# Patient Record
Sex: Female | Born: 1970 | Race: Black or African American | Hispanic: No | Marital: Single
Health system: Southern US, Community
[De-identification: ages and names within clinical notes are randomized; demographics above are authoritative.]

---

## 2018-04-20 ENCOUNTER — Encounter (HOSPITAL_COMMUNITY): Payer: Self-pay | Admitting: Nurse Practitioner

## 2018-04-20 DIAGNOSIS — R42 Dizziness and giddiness: Secondary | ICD-10-CM | POA: Insufficient documentation

## 2018-04-20 NOTE — ED Triage Notes (Signed)
Pt is c/o unrelenting dizziness for last month, reports she was seen at and [facility] on the 9th and was advised to f/u with PCP if symptoms did not resolve.

## 2018-04-21 ENCOUNTER — Emergency Department (HOSPITAL_COMMUNITY)
Admission: EM | Admit: 2018-04-21 | Discharge: 2018-04-21 | Disposition: A | Discharge status: Home / Self Care | Payer: No Typology Code available for payment source | Attending: Emergency Medicine | Admitting: Emergency Medicine

## 2018-04-21 ENCOUNTER — Encounter (INDEPENDENT_AMBULATORY_CARE_PROVIDER_SITE_OTHER): Payer: Self-pay

## 2018-04-21 ENCOUNTER — Emergency Department (HOSPITAL_COMMUNITY): Payer: No Typology Code available for payment source

## 2018-04-21 DIAGNOSIS — R42 Dizziness and giddiness: Secondary | ICD-10-CM

## 2018-04-21 LAB — COMPREHENSIVE METABOLIC PANEL
ALT: 16 U/L (ref 0–44)
ANION GAP: 7 (ref 5–15)
AST: 21 U/L (ref 15–41)
Albumin: 4 g/dL (ref 3.5–5.0)
Alkaline Phosphatase: 45 U/L (ref 38–126)
BUN: 14 mg/dL (ref 6–20)
CO2: 25 mmol/L (ref 22–32)
Calcium: 8.9 mg/dL (ref 8.9–10.3)
Chloride: 105 mmol/L (ref 98–111)
Creatinine, Ser: 0.83 mg/dL (ref 0.44–1.00)
GFR calc Af Amer: >60 mL/min (ref >60)
GFR calc non Af Amer: >60 mL/min (ref >60)
Glucose, Bld: 100 mg/dL — ABNORMAL HIGH (ref 70–99)
POTASSIUM: 3.5 mmol/L (ref 3.5–5.1)
Sodium: 137 mmol/L (ref 135–145)
Total Bilirubin: 0.5 mg/dL (ref 0.3–1.2)
Total Protein: 7.5 g/dL (ref 6.5–8.1)

## 2018-04-21 LAB — CBC WITH DIFFERENTIAL/PLATELET
Abs Immature Granulocytes: 0.02 10*3/uL (ref 0.00–0.07)
Basophils Absolute: 0.1 10*3/uL (ref 0.0–0.1)
Basophils Relative: 1 %
Eosinophils Absolute: 0.3 10*3/uL (ref 0.0–0.5)
Eosinophils Relative: 5 %
HCT: 39.6 % (ref 36.0–46.0)
Hemoglobin: 13.1 g/dL (ref 12.0–15.0)
Immature Granulocytes: 0 %
Lymphocytes Relative: 33 %
Lymphs Abs: 2.5 10*3/uL (ref 0.7–4.0)
MCH: 28.7 pg (ref 26.0–34.0)
MCHC: 33.1 g/dL (ref 30.0–36.0)
MCV: 86.7 fL (ref 80.0–100.0)
Monocytes Absolute: 0.7 10*3/uL (ref 0.1–1.0)
Monocytes Relative: 10 %
NEUTROS ABS: 4 10*3/uL (ref 1.7–7.7)
Neutrophils Relative %: 51 %
PLATELETS: 227 10*3/uL (ref 150–400)
RBC: 4.57 MIL/uL (ref 3.87–5.11)
RDW: 13.6 % (ref 11.5–15.5)
WBC: 7.6 10*3/uL (ref 4.0–10.5)
nRBC: 0 % (ref 0.0–0.2)

## 2018-04-21 LAB — I-STAT BETA HCG BLOOD, ED (MC, WL, AP ONLY): I-stat hCG, quantitative: <5 m[IU]/mL (ref <5)

## 2018-04-21 LAB — MAGNESIUM: Magnesium: 2.1 mg/dL (ref 1.7–2.4)

## 2018-04-21 MED ORDER — GADOBUTROL 1 MMOL/ML IV SOLN
5.0000 mL | Freq: Once | INTRAVENOUS | Status: AC | PRN
  Administered 2018-04-21: 5 mL via INTRAVENOUS

## 2018-04-21 MED ORDER — SODIUM CHLORIDE 0.9 % IV BOLUS
1000.0000 mL | Freq: Once | INTRAVENOUS | Status: AC
  Administered 2018-04-21: 1000 mL via INTRAVENOUS

## 2018-04-21 MED ORDER — MECLIZINE HCL 25 MG PO TABS
25.0000 mg | ORAL_TABLET | Freq: Once | ORAL | Status: AC
  Administered 2018-04-21: 25 mg via ORAL
  Filled 2018-04-21: qty 1

## 2018-04-21 MED ORDER — MECLIZINE HCL 25 MG PO TABS: 25.0000 mg | ORAL_TABLET | Freq: Three times a day (TID) | ORAL | 0 refills | Status: AC | PRN

## 2018-04-21 NOTE — ED Notes (Signed)
Patient transported to MRI 

## 2018-04-21 NOTE — ED Provider Notes (Signed)
Received care from Dr. Criss AlvineGoldston. Briefly, this is a 47yo female who presents with concern for dizziness.  MR pending.  MRI without acute findings. Patient improved after meclizine. Given rx for meclizine. Recommend PCP follow up. Patient discharged in stable condition with understanding of reasons to return.    Alvira MondaySchlossman, Makell Drohan, MD 04/22/18 [id number]

## 2018-04-21 NOTE — ED Provider Notes (Signed)
[facility] Provider Note   CSN: [id number] Arrival date & time: 04/20/18  2215     History   Chief Complaint Chief Complaint  Patient presents with  . Dizziness    HPI Alexis Bryant is a 47 y.o. female.  HPI  47 year old female presents with dizziness.  Has been ongoing for about 1 months.  She went to urgent [place] a [place] after it [place] and was prescribed prednisone and Zyrtec.  She also started off having hives that started on her trunk at the same time as the dizziness.  Urgent care gave her meds and the hives have gone away but the dizziness remains.  It is intermittent but nothing obvious induces it.  It does not feel like she is going to pass out.  It also does not feel like a room spinning sensation.  She describes it as a feeling of her head floating like it is under water.  Also feels like she is ingested some helium.  There is no blurry vision, vomiting, or weakness in her extremities.  There is no headache.  [place] she had significant symptoms that were worse than typical.  At that time she also felt like she was developing some numbness or a muscle cramp in her right leg as well as her left arm did not feel right.  The symptoms have resolved.  The dizziness is not as bad as it was in the car but is still is coming and going while she has been in the waiting room.  History reviewed. No pertinent past medical history.  There are no active problems to display for this patient.   History reviewed. No pertinent surgical history.   OB History   No obstetric history on file.      Home Medications    Prior to Admission medications   Medication Sig Start Date End Date Taking? Authorizing Provider  albuterol (PROVENTIL HFA;VENTOLIN HFA) 108 (90 Base) MCG/ACT inhaler Inhale 2 puffs into the lungs every 6 (six) hours as needed for wheezing or shortness of  breath.  08/13/17  Yes [provider]  cetirizine (ZYRTEC) 10 MG tablet Take 10 mg by mouth daily.   Yes [provider]  Methylphenidate HCl ER 54 MG TB24 Take 54 mg by mouth daily after breakfast. 11/21/17  Yes [provider]    Family History No family history on file.  Social History Social History   Tobacco Use  . Smoking status: Not on file  Substance Use Topics  . Alcohol use: Not on file  . Drug use: Not on file     Allergies   Patient has no known allergies.   Review of Systems Review of Systems  Constitutional: Negative for fever.  Eyes: Negative for visual disturbance.  Gastrointestinal: Negative for vomiting.  Musculoskeletal: Negative for neck pain.  Neurological: Positive for dizziness and numbness. Negative for weakness.  All other systems reviewed and are negative.    Physical Exam Updated Vital Signs BP 117/69   Pulse 65   Temp 98.8 F (37.1 C) (Oral)   Resp 18   SpO2 99%   Physical Exam Vitals signs and nursing note reviewed.  Constitutional:      General: She is not in acute distress.    Appearance: She is well-developed. She is not ill-appearing or diaphoretic.  HENT:     Head: Normocephalic and atraumatic.  Right Ear: Tympanic membrane and external ear normal.     Left Ear: Tympanic membrane and external ear normal.     Nose: Nose normal.  Eyes:     General:        Right eye: No discharge.        Left eye: No discharge.     Extraocular Movements: Extraocular movements intact.     Pupils: Pupils are equal, round, and reactive to light.  Cardiovascular:     Rate and Rhythm: Normal rate and regular rhythm.     Heart sounds: Normal heart sounds.  Pulmonary:     Effort: Pulmonary effort is normal.     Breath sounds: Normal breath sounds.  Abdominal:     Palpations: Abdomen is soft.     Tenderness: There is no abdominal tenderness.  Skin:    General: Skin is warm and dry.  Neurological:     Mental  Status: She is alert.     Comments: CN 3-12 grossly intact. 5/5 strength in all 4 extremities. Grossly normal sensation. Normal finger to nose.   Psychiatric:        Mood and Affect: Mood is not anxious.      ED Treatments / Results  Labs (all labs ordered are listed, but only abnormal results are displayed) Labs Reviewed  COMPREHENSIVE METABOLIC PANEL - Abnormal; Notable for the following components:      Result Value   Glucose, Bld 100 (*)    All other components within normal limits  CBC WITH DIFFERENTIAL/PLATELET  MAGNESIUM  I-STAT BETA HCG BLOOD, ED ([facility], [facility], [facility] ONLY)    EKG EKG Interpretation  Date/Time:  Sunday April 21 2018 05:16:39 EST Ventricular Rate:  73 PR Interval:    QRS Duration: 89 QT Interval:  398 QTC Calculation: 439 R Axis:   83 Text Interpretation:  Sinus rhythm Consider left ventricular hypertrophy Baseline wander in lead(s) V6 No old tracing to compare Confirmed by Pricilla LovelessGoldston, Cherilynn Schomburg [phone]([id number]) on 04/21/2018 5:20:03 AM   Radiology No results found.  Procedures Procedures (including critical care time)  Medications Ordered in ED Medications  sodium chloride 0.9 % bolus 1,000 mL (0 mLs Intravenous Stopped 04/21/18 0618)  meclizine (ANTIVERT) tablet 25 mg (25 mg Oral Given 04/21/18 0517)     Initial Impression / Assessment and Plan / ED Course  I have reviewed the triage vital signs and the nursing notes.  Pertinent labs & imaging results that were available during my care of the patient were reviewed by me and considered in my medical decision making (see chart for details).     Patient's vague symptoms are little concerning given the numbness she experienced in the car ride with her "dizziness".  Thus I will get an MRI to help rule out multiple sclerosis or other CNS pathology.  Otherwise her screening lab work is benign and her neuro exam is benign.  MRI currently pending, care transferred to Dr. Dalene SeltzerSchlossman.  Final Clinical Impressions(s) /  ED Diagnoses   Final diagnoses:  None    ED Discharge Orders    None       Pricilla LovelessGoldston, Xabi Wittler, MD 04/21/18 [phone]

## 2019-10-28 IMAGING — MR MR HEAD WO/W CM
11 of 15 series · 32 of 48 positions shown · IV contrast (gadavist)
Comparison: None.

CLINICAL DATA: Newly diagnosed multiple sclerosis.  Dizziness.

EXAM:
MRI HEAD WITHOUT AND WITH CONTRAST
TECHNIQUE: Multiplanar, multiecho pulse sequences of the brain and surrounding
structures were obtained without and with intravenous contrast.
CONTRAST:  5 cc Gadavist intravenous

[Series 3: T1 · sagittal · 5.0mm · 0.47mm/px · 1 of 21 slices shown]
[im 1/21]
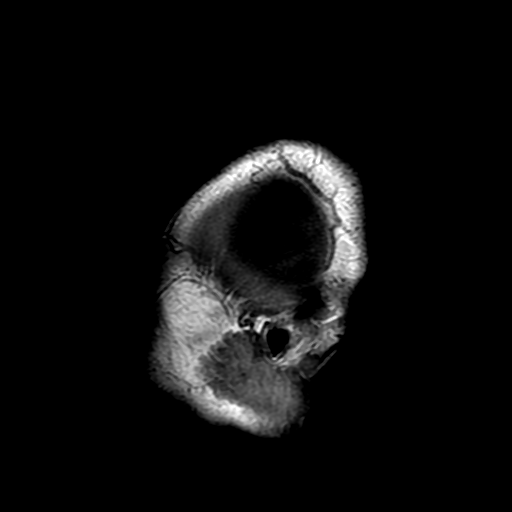

[Series 4: DWI · axial · 4.0mm · 1.17mm/px · z∈[-20,+117]mm · 4 of 64 slices shown (1 of 4)]
[im 1/64]
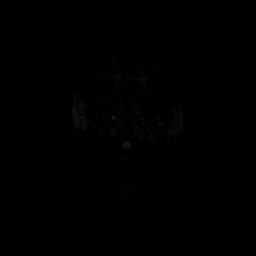
[im 22/64]
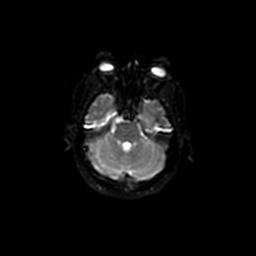
[im 43/64]
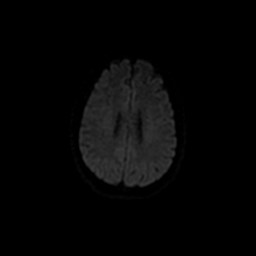
[im 64/64]
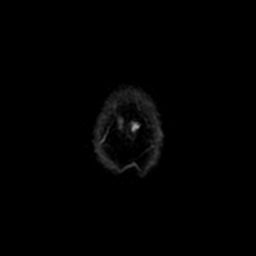

[Series 5: FLAIR · axial · 5.0mm · 0.86mm/px · 1 of 24 slices shown (1 of 2)]
[im 1/24]
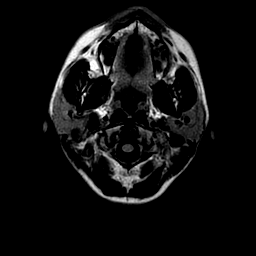

[Series 6: T2 · axial · 5.0mm · 0.86mm/px · 1 of 24 slices shown]
[im 1/24]
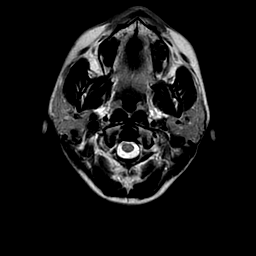

[Series 9: FLAIR · sagittal · 1.6mm · 0.47mm/px · 9 of 224 slices shown (2 of 2)]
[im 1/224]
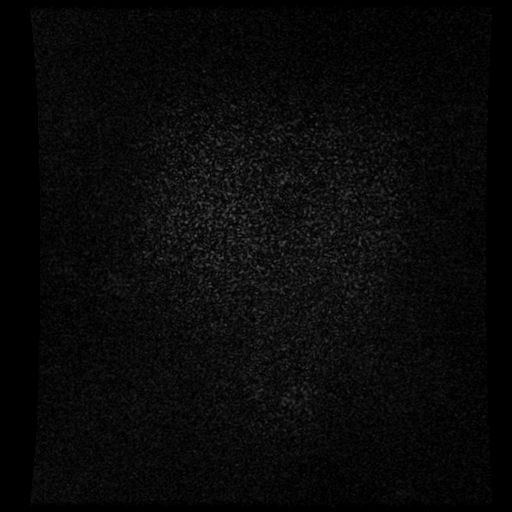
[im 38/224]
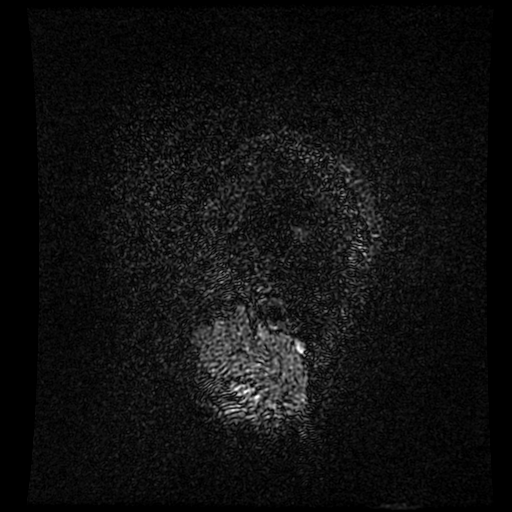
[im 75/224]
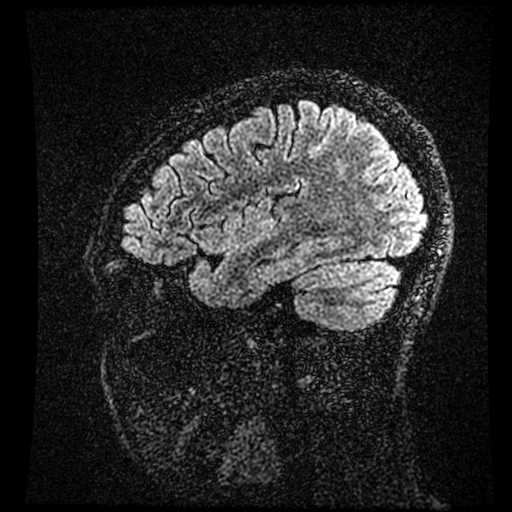
[im 93/224]
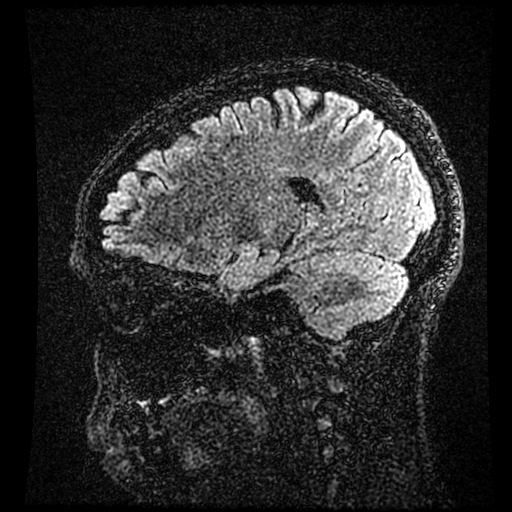
[im 112/224]
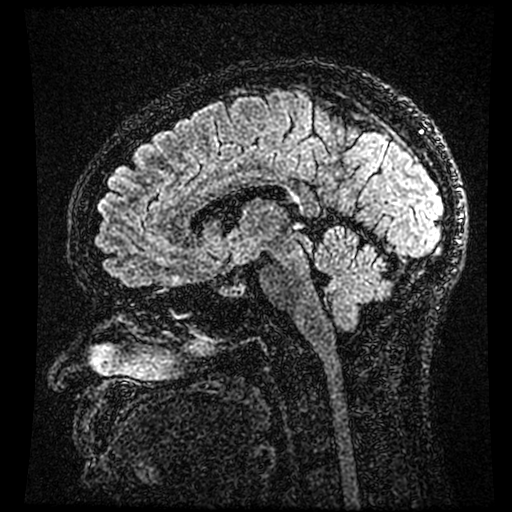
[im 131/224]
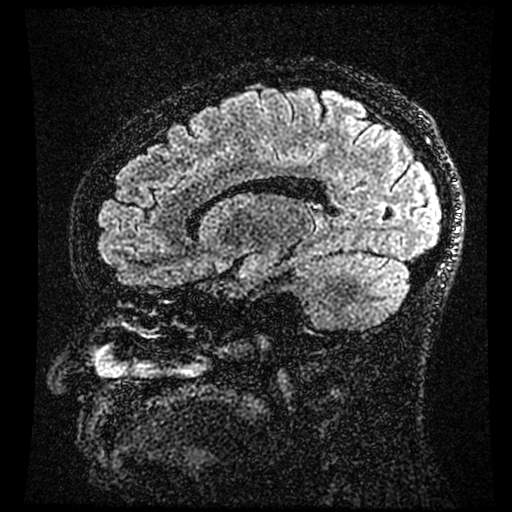
[im 149/224]
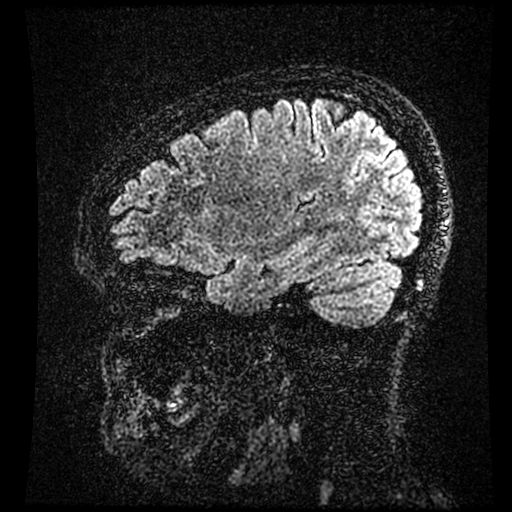
[im 186/224]
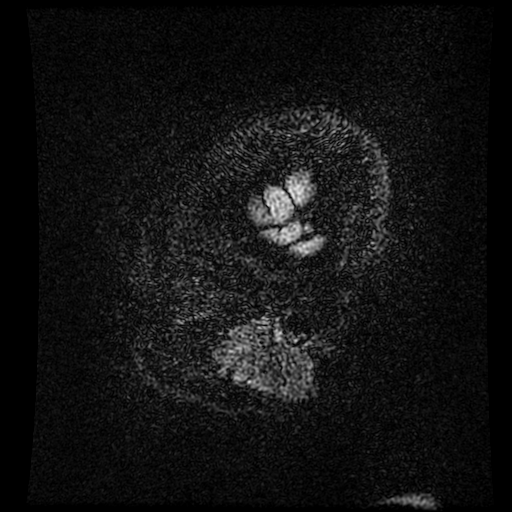
[im 224/224]
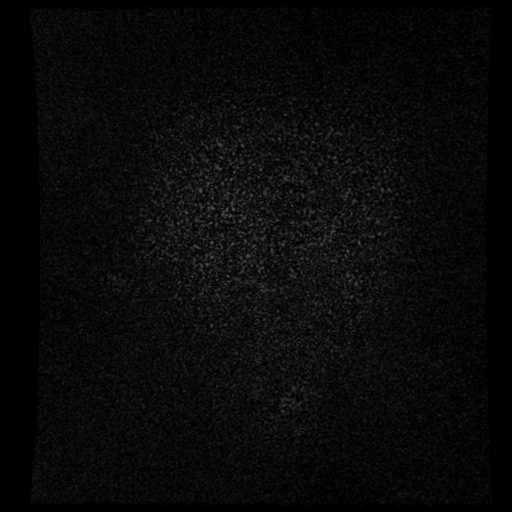

[Series 10: DWI · coronal · 3.0mm · 1.09mm/px · 6 of 112 slices shown (2 of 4)]
[im 1/112]
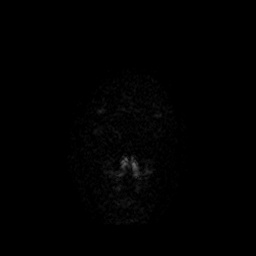
[im 23/112]
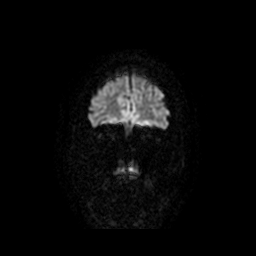
[im 45/112]
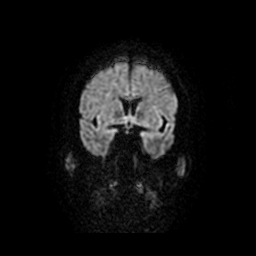
[im 67/112]
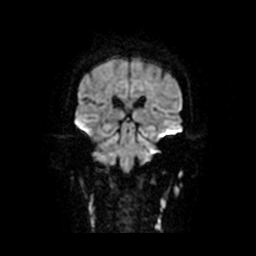
[im 89/112]
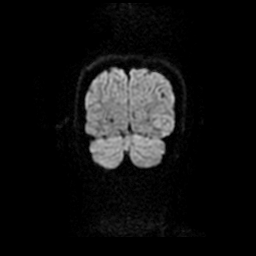
[im 112/112]
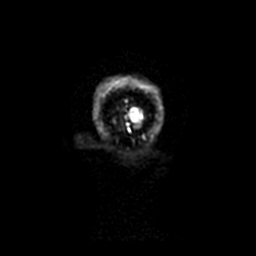

[Series 11: T2 post-contrast · coronal · 5.0mm · 0.90mm/px · 2 of 27 slices shown]
[im 1/27]
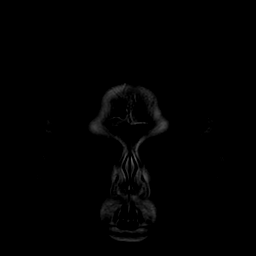
[im 27/27]
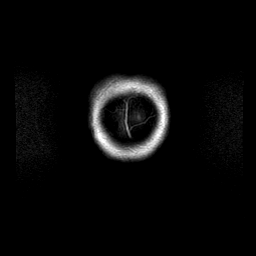

[Series 13: T1 post-contrast · coronal · 5.0mm · 0.45mm/px · 2 of 27 slices shown (1 of 2)]
[im 1/27]
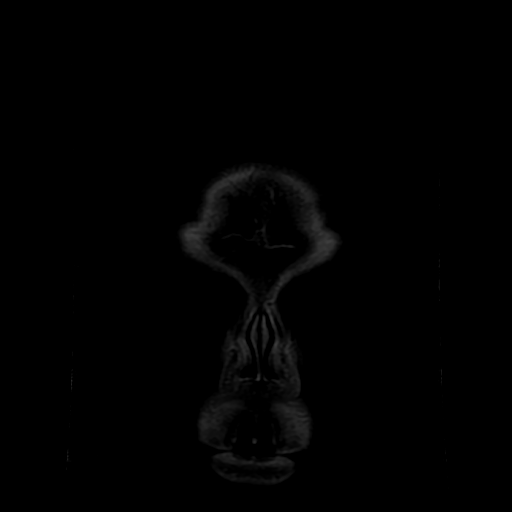
[im 27/27]
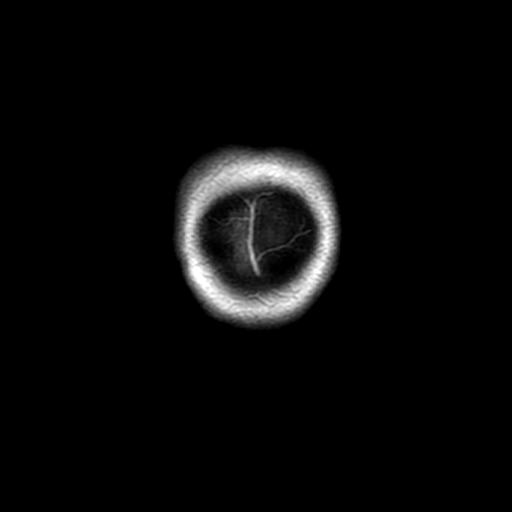

[Series 14: T1 post-contrast · sagittal · 5.0mm · 0.47mm/px · 1 of 21 slices shown (2 of 2)]
[im 1/21]
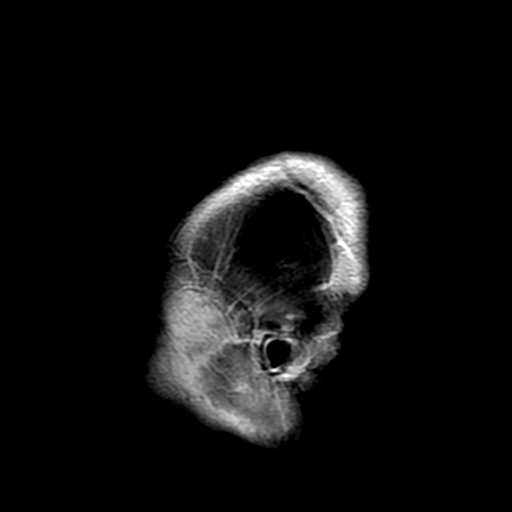

[Series 400: DWI · axial · 4.0mm · 1.17mm/px · z∈[-20,+117]mm · 2 of 32 slices shown (3 of 4)]
[im 1/32]
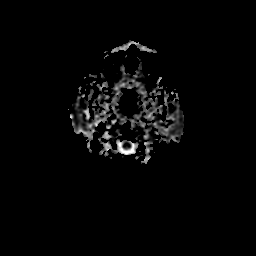
[im 32/32]
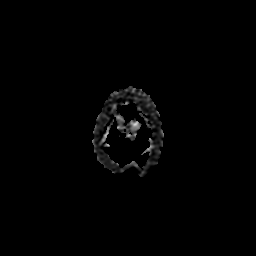

[Series 1000: DWI · coronal · 3.0mm · 1.09mm/px · 3 of 56 slices shown (4 of 4)]
[im 1/56]
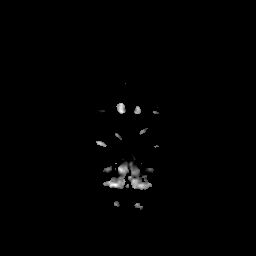
[im 28/56]
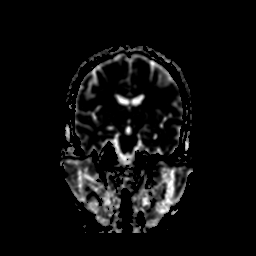
[im 56/56]
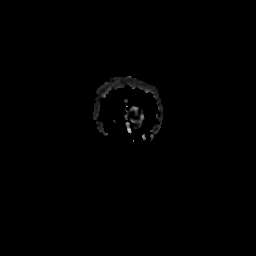

[32 of 48 positions shown; findings below may reference images not displayed]

FINDINGS: Brain: Small scattered FLAIR hyperintensities in the cerebral white
matter numbering between 5 and 10. These do not have a specific
pattern and could be from old inflammation, ischemia, or trauma.
Brain volume is normal. No infarct, hemorrhage, hydrocephalus, or
collection. No abnormal intracranial enhancement.

Vascular: Major flow voids and vascular enhancements are preserved

Skull and upper cervical spine: Negative for marrow lesion

Sinuses/Orbits: Negative
IMPRESSION: 1. No acute finding.
2. Few small remote white matter insults that are nonspecific.
